# Patient Record
Sex: Female | Born: 2000 | Race: Black or African American | Hispanic: No | Marital: Single | Smoking: Never smoker
Health system: Southern US, Community
[De-identification: ages and names within clinical notes are randomized; demographics above are authoritative.]

## PROBLEM LIST (undated history)

## (undated) DIAGNOSIS — G43909 Migraine, unspecified, not intractable, without status migrainosus: Secondary | ICD-10-CM

## (undated) DIAGNOSIS — T7840XA Allergy, unspecified, initial encounter: Secondary | ICD-10-CM

## (undated) DIAGNOSIS — J302 Other seasonal allergic rhinitis: Secondary | ICD-10-CM

## (undated) DIAGNOSIS — D242 Benign neoplasm of left breast: Secondary | ICD-10-CM

## (undated) HISTORY — DX: Benign neoplasm of left breast: D24.2

## (undated) HISTORY — DX: Other seasonal allergic rhinitis: J30.2

## (undated) HISTORY — DX: Migraine, unspecified, not intractable, without status migrainosus: G43.909

## (undated) HISTORY — DX: Allergy, unspecified, initial encounter: T78.40XA

---
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
## 2021-12-20 DIAGNOSIS — F411 Generalized anxiety disorder: Secondary | ICD-10-CM | POA: Insufficient documentation

## 2023-08-09 ENCOUNTER — Other Ambulatory Visit: Payer: Self-pay

## 2023-08-09 ENCOUNTER — Emergency Department (HOSPITAL_BASED_OUTPATIENT_CLINIC_OR_DEPARTMENT_OTHER)
Admission: EM | Admit: 2023-08-09 | Discharge: 2023-08-09 | Disposition: A | Discharge status: Home / Self Care | Attending: Emergency Medicine | Admitting: Emergency Medicine

## 2023-08-09 ENCOUNTER — Encounter (HOSPITAL_BASED_OUTPATIENT_CLINIC_OR_DEPARTMENT_OTHER): Payer: Self-pay | Admitting: *Deleted

## 2023-08-09 DIAGNOSIS — M5431 Sciatica, right side: Secondary | ICD-10-CM | POA: Diagnosis not present

## 2023-08-09 DIAGNOSIS — M545 Low back pain, unspecified: Secondary | ICD-10-CM | POA: Diagnosis present

## 2023-08-09 LAB — URINALYSIS, ROUTINE W REFLEX MICROSCOPIC
Bilirubin Urine: NEGATIVE
Glucose, UA: NEGATIVE mg/dL
Hgb urine dipstick: NEGATIVE
Ketones, ur: 15 mg/dL — AB
Leukocytes,Ua: NEGATIVE
Nitrite: POSITIVE — AB
Protein, ur: NEGATIVE mg/dL
Specific Gravity, Urine: >=1.03 (ref 1.005–1.030)
pH: 6 (ref 5.0–8.0)

## 2023-08-09 LAB — URINALYSIS, MICROSCOPIC (REFLEX)

## 2023-08-09 LAB — PREGNANCY, URINE: Preg Test, Ur: NEGATIVE

## 2023-08-09 MED ORDER — METHOCARBAMOL 500 MG PO TABS: 500.0000 mg | ORAL_TABLET | Freq: Three times a day (TID) | ORAL | 0 refills | Status: DC | PRN

## 2023-08-09 MED ORDER — METHOCARBAMOL 500 MG PO TABS
500.0000 mg | ORAL_TABLET | Freq: Once | ORAL | Status: AC
  Administered 2023-08-09: 500 mg via ORAL
  Filled 2023-08-09: qty 1

## 2023-08-09 MED ORDER — HYDROCODONE-ACETAMINOPHEN 5-325 MG PO TABS
1.0000 | ORAL_TABLET | Freq: Once | ORAL | Status: AC
  Administered 2023-08-09: 1 via ORAL
  Filled 2023-08-09: qty 1

## 2023-08-09 MED ORDER — CEPHALEXIN 500 MG PO CAPS: 500.0000 mg | ORAL_CAPSULE | Freq: Three times a day (TID) | ORAL | 0 refills | Status: AC

## 2023-08-09 MED ORDER — METHYLPREDNISOLONE 4 MG PO TBPK: ORAL_TABLET | ORAL | 0 refills | Status: DC

## 2023-08-09 MED ORDER — IBUPROFEN 800 MG PO TABS
800.0000 mg | ORAL_TABLET | Freq: Once | ORAL | Status: AC
  Administered 2023-08-09: 800 mg via ORAL
  Filled 2023-08-09: qty 1

## 2023-08-09 NOTE — Discharge Instructions (Signed)
 Take the anti-inflammatories and muscle relaxers as prescribed.  Take the antibiotic for a possible urinary tract infection.  Follow-up with your primary doctor and the spine doctor.  Return to the ED with worsening pain, weakness, numbness, tingling, incontinence, other concerns.

## 2023-08-09 NOTE — ED Provider Notes (Signed)
 [facility] [place] Provider Note   CSN: [id number] Arrival date & time: 08/09/23  0035     History  Chief Complaint  Patient presents with   Back Pain    Kara Forbes is a 23 y.o. female.  Patient denies medical history.  She is here with a 2-week history of right-sided low back pain that radiates down her right leg.  Denies any fall or injury.  Pain started in her right back but now is involving her leg as well.  Using ibuprofen at home without relief.  No weakness, numbness, tingling, bowel or bladder incontinence.  No fever or vomiting.  No chest pain or shortness of breath.  She is never had this kind of back pain in the past.  No history of chronic back problems.  No history of IV drug abuse or cancer.  Denies any saddle anesthesia.  Denies any abdominal pain, fever, pain with urination, blood in the urine, chest pain or shortness of breath.  The history is provided by the patient.  Back Pain Associated symptoms: no abdominal pain, no chest pain, no dysuria, no headaches and no weakness        Home Medications Prior to Admission medications   Medication Sig Start Date End Date Taking? Authorizing Provider  traZODone (DESYREL) 50 MG tablet Take 50 mg by mouth at bedtime as needed for sleep.   Yes [provider]  PREVIDENT 5000 BOOSTER PLUS 1.1 % PSTE  03/07/16   [provider]      Allergies    Patient has no known allergies.    Review of Systems   Review of Systems  Constitutional:  Negative for activity change and appetite change.  HENT:  Negative for congestion and rhinorrhea.   Respiratory:  Negative for cough, choking and chest tightness.   Cardiovascular:  Negative for chest pain.  Gastrointestinal:  Negative for abdominal pain, nausea and vomiting.  Genitourinary:  Negative for dysuria and hematuria.  Musculoskeletal:  Positive for arthralgias, back pain and myalgias.  Skin:  Negative for rash.   Neurological:  Negative for dizziness, weakness and headaches.   all other systems are negative except as noted in the HPI and PMH.    Physical Exam Updated Vital Signs BP (!) 147/107   Pulse 72   Temp 98.8 F (37.1 C) (Oral)   Resp 16   LMP 07/10/2023   SpO2 100%  Physical Exam Vitals and nursing note reviewed.  Constitutional:      General: She is not in acute distress.    Appearance: She is well-developed.  HENT:     Head: Normocephalic and atraumatic.     Mouth/Throat:     Pharynx: No oropharyngeal exudate.  Eyes:     Conjunctiva/sclera: Conjunctivae normal.     Pupils: Pupils are equal, round, and reactive to light.  Neck:     Comments: No meningismus. Cardiovascular:     Rate and Rhythm: Normal rate and regular rhythm.     Heart sounds: Normal heart sounds. No murmur heard. Pulmonary:     Effort: Pulmonary effort is normal. No respiratory distress.     Breath sounds: Normal breath sounds.  Abdominal:     Palpations: Abdomen is soft.     Tenderness: There is no abdominal tenderness. There is no guarding or rebound.  Musculoskeletal:        General: Tenderness present. Normal range of motion.     Cervical back: Normal range of motion  and neck supple.     Comments: Right paraspinal lumbar tenderness  5/5 strength in bilateral lower extremities. Ankle plantar and dorsiflexion intact. Great toe extension intact bilaterally. +2 DP and PT pulses. +2 patellar reflexes bilaterally. Normal gait.   Skin:    General: Skin is warm.  Neurological:     Mental Status: She is alert and oriented to person, place, and time.     Cranial Nerves: No cranial nerve deficit.     Motor: No abnormal muscle tone.     Coordination: Coordination normal.     Comments:  5/5 strength throughout. CN 2-12 intact.Equal grip strength.   Psychiatric:        Behavior: Behavior normal.     ED Results / Procedures / Treatments   Labs (all labs ordered are listed, but only abnormal results  are displayed) Labs Reviewed  URINALYSIS, ROUTINE W REFLEX MICROSCOPIC - Abnormal; Notable for the following components:      Result Value   APPearance CLOUDY (*)    Ketones, ur 15 (*)    Nitrite POSITIVE (*)    All other components within normal limits  URINALYSIS, MICROSCOPIC (REFLEX) - Abnormal; Notable for the following components:   Bacteria, UA MANY (*)    All other components within normal limits  PREGNANCY, URINE    EKG None  Radiology No results found.  Procedures Procedures    Medications Ordered in ED Medications  methocarbamol (ROBAXIN) tablet 500 mg (has no administration in time range)  HYDROcodone-acetaminophen (NORCO/VICODIN) 5-325 MG per tablet 1 tablet (has no administration in time range)  ibuprofen (ADVIL) tablet 800 mg (has no administration in time range)    ED Course/ Medical Decision Making/ A&P                                 Medical Decision Making Amount and/or Complexity of Data Reviewed Labs: ordered. Decision-making details documented in ED Course. Radiology: ordered and independent interpretation performed. Decision-making details documented in ED Course. ECG/medicine tests: ordered and independent interpretation performed. Decision-making details documented in ED Course.  Risk Prescription drug management.   Right-sided low back pain going down right leg x 2 weeks.  No trauma.  Intact distal strength, sensation, pulses and reflexes.  Low concern for cord compression or cauda equina.  No red flags.  Will treat as sciatica.  Urinalysis is nitrate positive but she denies any UTI symptoms.  hCG is negative.  No indication of emergent MRI today. Will send culture.  Will give course of steroids, muscle relaxers and anti-inflammatories.  Follow-up with PCP for MRI.  Return to the ED sooner if worsening pain, weakness, numbness, tingling, bowel or bladder incontinence or other concerns.        Final Clinical Impression(s) / ED  Diagnoses Final diagnoses:  Sciatica of right side    Rx / DC Orders ED Discharge Orders     None         Dali Kraner, Mara [place], MD 08/09/23 0630

## 2023-08-09 NOTE — ED Triage Notes (Signed)
 Pt ambulatory to triage, states about 2 weeks ago, onset of lower back pain, pain goes down her right buttock and right leg. Pain worse to certain movement. Denies injury. Denies numbness or tingling. Taking ibuprofen without relief.

## 2023-08-11 LAB — URINE CULTURE: Culture: >=100000 — AB

## 2023-08-12 ENCOUNTER — Telehealth (HOSPITAL_BASED_OUTPATIENT_CLINIC_OR_DEPARTMENT_OTHER): Payer: Self-pay | Admitting: *Deleted

## 2023-08-12 NOTE — Telephone Encounter (Signed)
 Post ED Visit - Positive Culture Follow-up  Culture report reviewed by antimicrobial stewardship pharmacist: [facility] [address] [place].D. []  Skeet Duke, Pharm.D., BCPS AQ-ID []  Leslee Rase, Pharm.D., BCPS []  Garland Junk, Pharm.D., BCPS []  [place], [address].D., BCPS, AAHIVP []  Alcide Aly, Pharm.D., BCPS, AAHIVP []  Jerri Morale, PharmD, BCPS []  Graham Laws, PharmD, BCPS []  Cleda Curly, PharmD, BCPS []  Tamar Fairly, PharmD []  Ballard Levels, PharmD, BCPS []  Ollen Beverage, PharmD  [facility] []  Arlyne Bering, PharmD []  Sherryle Don, PharmD []  Van Gelinas, PharmD []  Delila Felty, Rph []  [place]) Cleora Daft, PharmD []  Augustina Block, PharmD []  Arie Kurtz, PharmD []  Sharlyn Deaner, PharmD []  Agnes Hose, PharmD []  Kendall Pauls, PharmD []  Gladstone Lamer, PharmD []  Armanda Bern, PharmD []  Tera Fellows, PharmD   Positive urine culture Treated with Cephalexin , organism sensitive to the same and no further patient follow-up is required at this time.  Jessee Mormon 08/12/2023, 8:27 AM

## 2023-08-26 ENCOUNTER — Ambulatory Visit: Payer: Self-pay

## 2023-08-26 NOTE — Telephone Encounter (Signed)
 Chief Complaint: Leg pain, right Symptoms: 8/10 pain at worst Frequency: x 4 weeks Pertinent Negatives: Patient denies SOB, CP, swelling, redness Disposition: [] ED /[] Urgent Care (no appt availability in office) / [x] Appointment(In office/virtual)/ []  [facility]/ [] Home Care/ [] Refused Recommended Disposition /[] [facility]/ []  Follow-up with PCP Additional Notes: Pt reports she has been experiencing 8/10 leg pain from hip to calf. Denies swelling, CP, SOB, redness. NP appt scheduled. This RN educated pt on home care, new-worsening symptoms, when to call back/seek emergent care. Pt verbalized understanding and agrees to plan.    Copied from CRM [id number]. Topic: Clinical - Red Word Triage >> Aug 26, 2023  8:41 AM Leory Rands wrote: Red Word that prompted transfer to Nurse Triage: Patient is calling to report right leg pain 7-8/10 with no swelling for 4 weeks. Went to ED on Aug 09, 2023. And Pain is still persistent. Advised to follow up with PCP. Reason for Disposition  [1] MODERATE pain (e.g., interferes with normal activities, limping) AND [2] present > 3 days  Answer Assessment - Initial Assessment Questions 1. ONSET: "When did the pain start?"      X 4 weeks 2. LOCATION: "Where is the pain located?"      Right, Hip to calf 3. PAIN: "How bad is the pain?"    (Scale 1-10; or mild, moderate, severe)   -  MILD (1-3): doesn't interfere with normal activities    -  MODERATE (4-7): interferes with normal activities (e.g., work or school) or awakens from sleep, limping    -  SEVERE (8-10): excruciating pain, unable to do any normal activities, unable to walk     8/10 currently 6/10, taking Robaxin  from ED 4. WORK OR EXERCISE: "Has there been any recent work or exercise that involved this part of the body?"      None 5. CAUSE: "What do you think is causing the leg pain?"     Unknown 6. OTHER SYMPTOMS: "Do you have any other symptoms?" (e.g., chest pain, back pain,  breathing difficulty, swelling, rash, fever, numbness, weakness)     None  Protocols used: Leg Pain-A-AH

## 2023-08-30 ENCOUNTER — Encounter: Payer: Self-pay | Admitting: General Practice

## 2023-08-30 ENCOUNTER — Ambulatory Visit: Admitting: General Practice

## 2023-08-30 ENCOUNTER — Ambulatory Visit (INDEPENDENT_AMBULATORY_CARE_PROVIDER_SITE_OTHER)
Admission: RE | Admit: 2023-08-30 | Discharge: 2023-08-30 | Disposition: A | Discharge status: Home / Self Care | Source: Ambulatory Visit | Attending: General Practice | Admitting: General Practice

## 2023-08-30 VITALS — BP 118/80 | HR 60 | Temp 98.3°F | Ht 63.25 in | Wt 228.8 lb

## 2023-08-30 DIAGNOSIS — M545 Low back pain, unspecified: Secondary | ICD-10-CM

## 2023-08-30 DIAGNOSIS — Z7689 Persons encountering health services in other specified circumstances: Secondary | ICD-10-CM | POA: Insufficient documentation

## 2023-08-30 MED ORDER — CYCLOBENZAPRINE HCL 5 MG PO TABS: 2.5000 mg | ORAL_TABLET | Freq: Every evening | ORAL | 0 refills | Status: AC | PRN

## 2023-08-30 NOTE — Progress Notes (Signed)
 New Patient Office Visit  Subjective    Patient ID: Kara Forbes, female    DOB: 01-Apr-2000  Age: 23 y.o. MRN: [id number]  CC:  Chief Complaint  Patient presents with   Establish Care    Pt complains R leg pain ongoing for a month. No tingling or numbness. Doctors in ER told her it was a possible pinched nerves.     HPI Kara Forbes is a 23 y.o. female presents to establish care.   Last pcp/physical/labs: [facility]; physical and labs over a year ago.   Right leg pain: symptom onset a month ago. Started with low back pain, which then started radiating to right lower leg. Pain is constant. Pain intensifies depending on activity. Flexing and extending her leg makes the pain worse.  She was evaluated at the ER on 08/09/23 and was prescribed Robaxin  500 mg PRN which has been effective but for short period of time. She was given a prednisone  taper, she repots that she did not finish it all. She still had two days worth. She reports that the pain is still the same like it was a month ago with no improvement.  No urinary or stool incontinence.  No falls, trauma or injury.  No fever, chills, trauma or injury.   She was given a referral to neurosurgery to schedule an appt in two days and get a possible MRI. Patient did not call to make the appointment.    Outpatient Encounter Medications as of 08/30/2023  Medication Sig   cyclobenzaprine (FLEXERIL) 5 MG tablet Take 0.5-1 tablets (2.5-5 mg total) by mouth at bedtime as needed.   traZODone (DESYREL) 50 MG tablet Take 50 mg by mouth at bedtime as needed for sleep.   [DISCONTINUED] methocarbamol  (ROBAXIN ) 500 MG tablet Take 1 tablet (500 mg total) by mouth every 8 (eight) hours as needed for muscle spasms.   [DISCONTINUED] methylPREDNISolone  (MEDROL  DOSEPAK) 4 MG TBPK tablet As directed   [DISCONTINUED] PREVIDENT 5000 BOOSTER PLUS 1.1 % PSTE    No facility-administered encounter medications on file as of 08/30/2023.    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  Repeat L breast u/s in 79mo recommended--pt and parent aware.  Stable as of u/s 10/2014.   Migraines    Seasonal allergic rhinitis    prn antihistamine helps    Past Surgical History:  Procedure Laterality Date   TONSILLECTOMY AND ADENOIDECTOMY  2006    Family History  Problem Relation Age of Onset   Arthritis Mother    Hypertension Father    Colon cancer Other        Maternal Great-Grandfather   Diabetes Maternal Grandfather    Diabetes Mother    Hypertension Maternal Grandmother    Migraines Mother    Migraines Paternal Grandmother     Social History   Socioeconomic History   Marital status: Single    Spouse name: Not on file   Number of children: Not on file   Years of education: Not on file   Highest education level: GED or equivalent  Occupational History   Not on file  Tobacco Use   Smoking status: Never   Smokeless tobacco: Never  Vaping Use   Vaping status: Never Used  Substance and Sexual Activity   Alcohol use: Not Currently   Drug use: No   Sexual activity: Never  Other Topics Concern   Not on file  Social History Narrative   ** Merged History Encounter **       Lives with mom, dad, sister and brother in [place].   [employer], middle school.   Enjoys music/singing.      Social Drivers of Health   Financial Resource Strain: Medium Risk (08/29/2023)   Overall Financial Resource Strain (CARDIA)    Difficulty of Paying Living Expenses: Somewhat hard  Food Insecurity: Food Insecurity Present (08/29/2023)   Hunger Vital Sign    Worried About Running Out of Food in the Last Year: Never true    Ran Out of Food in the Last Year: Sometimes true  Transportation Needs: No Transportation Needs (08/29/2023)   PRAPARE - Administrator, Civil Service (Medical): No    Lack of Transportation (Non-Medical): No  Physical Activity:  Unknown (08/29/2023)   Exercise Vital Sign    Days of Exercise per Week: 0 days    Minutes of Exercise per Session: Not on file  Stress: Stress Concern Present (08/29/2023)   [employer] of Occupational Health - Occupational Stress Questionnaire    Feeling of Stress : To some extent  Social Connections: Moderately Isolated (08/29/2023)   Social Connection and Isolation Panel [NHANES]    Frequency of Communication with Friends and Family: More than three times a week    Frequency of Social Gatherings with Friends and Family: Once a week    Attends Religious Services: More than 4 times per year    Active Member of [employer] or Organizations: No    Attends Engineer, structural: Not on file    Marital Status: Never married  Intimate Partner Violence: Not on file    Review of Systems  Constitutional:  Negative for chills and fever.  Respiratory:  Negative for shortness of breath.   Cardiovascular:  Negative for chest pain.  Gastrointestinal:  Negative for abdominal pain, constipation, diarrhea, heartburn, nausea and vomiting.  Genitourinary:  Negative for dysuria, frequency and urgency.  Musculoskeletal:  Positive for back pain and myalgias.  Neurological:  Negative for dizziness and headaches.  Endo/Heme/Allergies:  Negative for polydipsia.  Psychiatric/Behavioral:  Negative for depression and suicidal ideas. The patient is not nervous/anxious.         Objective    BP 118/80   Pulse 60   Temp 98.3 F (36.8 C) (Oral)   Ht 5' 3.25" (1.607 m)   Wt 228 lb 12.8 oz (103.8 kg)   LMP 08/19/2023 (Exact Date)   SpO2 98%   BMI 40.21 kg/m   Physical Exam Vitals and nursing note reviewed.  Constitutional:      Appearance: Normal appearance.  Cardiovascular:     Rate and Rhythm: Normal rate and regular rhythm.     Pulses: Normal pulses.     Heart sounds: Normal heart sounds.  Pulmonary:     Effort: Pulmonary effort is normal.     Breath sounds: Normal breath sounds.   Musculoskeletal:        General: No swelling, tenderness, deformity or signs of injury.     Right knee: Decreased range of motion.     Left knee: Normal range of motion.     Right lower leg: No edema.     Left lower leg: No edema.     Comments: Negative straight leg test  Neurological:     Mental Status: She is alert and oriented to person, place, and time.  Psychiatric:        Mood and Affect: Mood  normal.        Behavior: Behavior normal.        Thought Content: Thought content normal.        Judgment: Judgment normal.         Assessment & Plan:  Acute bilateral low back pain, unspecified whether sciatica present Assessment & Plan: Unclear etiology.  Reviewed notes from ER visit from May, 2025.   Discussed at length.  Phone number provided for neurosurgery.  Trial of cyclobenzaprine 2.5-5 mg at bedtime PRN. Rx sent.   F/u in 2-3 weeks.  X-ray pending. Await results.  Orders: -     DG Lumbar Spine Complete -     Cyclobenzaprine HCl; Take 0.5-1 tablets (2.5-5 mg total) by mouth at bedtime as needed.  Dispense: 30 tablet; Refill: 0  Establishing care with new doctor, encounter for Assessment & Plan: EMR reviewed briefly.       Return in about 3 weeks (around 09/20/2023) for back pain.Jolanda Nation, NP

## 2023-08-30 NOTE — Patient Instructions (Addendum)
 Complete xray(s) prior to leaving today. I will notify you of your results once received.  Start cyclobenzaprine 2.5-5 mg at bedtime as needed.  Do not take trazodone with it.  Do not take robaxin  with it.   Call and schedule appt with neurosurgery. Easter Golden Caylin, PA-C. Schedule an appointment as soon as possible for a visit in 2 days.  Specialty: Neurosurgery Contact information: [address] [place] [address] [phone]  Follow up in 2-3 weeks.   It was a pleasure to see you today!

## 2023-08-30 NOTE — Assessment & Plan Note (Signed)
 EMR reviewed briefly.

## 2023-08-30 NOTE — Assessment & Plan Note (Signed)
 Unclear etiology.  Reviewed notes from ER visit from May, 2025.   Discussed at length.  Phone number provided for neurosurgery.  Trial of cyclobenzaprine 2.5-5 mg at bedtime PRN. Rx sent.   F/u in 2-3 weeks.  X-ray pending. Await results.

## 2023-09-02 ENCOUNTER — Ambulatory Visit: Payer: Self-pay | Admitting: General Practice

## 2023-09-04 ENCOUNTER — Encounter: Payer: Self-pay | Admitting: General Practice

## 2023-09-20 ENCOUNTER — Ambulatory Visit: Admitting: General Practice

## 2024-04-10 ENCOUNTER — Telehealth: Payer: Self-pay

## 2024-04-10 NOTE — Telephone Encounter (Signed)
 Noted

## 2024-04-10 NOTE — Transitions of Care (Post Inpatient/ED Visit) (Unsigned)
 I spoke with pt; pt was seen [facility] ED vaginal bleeding x 2. pt said had miscarriage and pt has already followed up with OB GYN and has been released from [facility] GYN care unless problems. pt said if needs anything from [facility] pt will call. Sending note to KATHEE Aurora NP.       04/10/2024  Name: Kara Forbes MRN: [id number] DOB: 06/23/2000  Today's TOC FU Call Status: Today's TOC FU Call Status:: Successful TOC FU Call Completed  Patient's Name and Date of Birth confirmed. Name, DOB  Transition Care Management Follow-up Telephone Call Date of Discharge: 04/06/24 Discharge Facility: [facility] Type of Discharge: Emergency Department Reason for ED Visit:  (I spoke with pt; pt was seen [facility] ED vaginal bleeding x 2. pt said had miscarriage and pt has already followed up with OB GYN and has been released from [facility] GYN care unless problems. pt said if needs anything from [facility] pt will call.) How have you been since you were released from the hospital?: Better Any questions or concerns?: No  Items Reviewed: Did you receive and understand the discharge instructions provided?: Yes Medications obtained,verified, and reconciled?: No Medications Not Reviewed Reasons:: Other: (pt has already been followed by OB GYN.) Any new allergies since your discharge?: No Dietary orders reviewed?: NA  Medications Reviewed Today: Medications Reviewed Today   Medications were not reviewed in this encounter     Home Care and Equipment/Supplies: Were Home Health Services Ordered?: NA Any new equipment or medical supplies ordered?: NA  Functional Questionnaire: Do you need assistance with bathing/showering or dressing?: No Do you need assistance with meal preparation?: No Do you need assistance with eating?: No Do you have difficulty maintaining continence: No Do you need assistance with getting out of bed/getting out of a chair/moving?: No Do you have difficulty managing  or taking your medications?: No  Follow up appointments reviewed: PCP Follow-up appointment confirmed?: No MD Provider Line Number:[phone] Given: No Specialist Hospital Follow-up appointment confirmed?: Yes Date of Specialist follow-up appointment?: 04/07/24 Follow-Up Specialty Provider:: OB GYN Do you need transportation to your follow-up appointment?: No Do you understand care options if your condition(s) worsen?: Yes-patient verbalized understanding    SIGNATURE [name], LPN
